# Patient Record
Sex: Male | Born: 1960 | Race: White | Hispanic: No | Marital: Single | Smoking: Never smoker
Health system: Southern US, Community
[De-identification: ages and names within clinical notes are randomized; demographics above are authoritative.]

## PROBLEM LIST (undated history)

## (undated) HISTORY — PX: HERNIA REPAIR: SHX51

## (undated) HISTORY — PX: HYDROCELE EXCISION / REPAIR: SUR1145

## (undated) HISTORY — PX: HIP ARTHROPLASTY: SHX981

---

## 2004-10-28 ENCOUNTER — Ambulatory Visit (HOSPITAL_BASED_OUTPATIENT_CLINIC_OR_DEPARTMENT_OTHER): Admission: RE | Admit: 2004-10-28 | Discharge: 2004-10-28 | Payer: Self-pay | Admitting: General Surgery

## 2004-10-28 ENCOUNTER — Ambulatory Visit (HOSPITAL_COMMUNITY): Admission: RE | Admit: 2004-10-28 | Discharge: 2004-10-28 | Payer: Self-pay | Admitting: General Surgery

## 2009-07-28 ENCOUNTER — Ambulatory Visit (HOSPITAL_BASED_OUTPATIENT_CLINIC_OR_DEPARTMENT_OTHER): Admission: RE | Admit: 2009-07-28 | Discharge: 2009-07-28 | Payer: Self-pay | Admitting: Urology

## 2011-03-03 LAB — POCT HEMOGLOBIN-HEMACUE: Hemoglobin: 15 g/dL (ref 13.0–17.0)

## 2011-04-11 NOTE — Op Note (Signed)
NAMECECILIA, Gregory Gentry              ACCOUNT NO.:  [id number]   MEDICAL RECORD NO.:  [id number]          PATIENT TYPE:  AMB   LOCATION:  [facility]                         FACILITY:  [facility]   PHYSICIAN:  Valetta Fuller, M.D.  DATE OF BIRTH:  September 21, 1961   DATE OF PROCEDURE:  07/28/2009  DATE OF DISCHARGE:  07/28/2009                               OPERATIVE REPORT   PREOPERATIVE DIAGNOSIS:  Large symptomatic left hydrocele.   POSTOPERATIVE DIAGNOSIS:  Large symptomatic left hydrocele.   PROCEDURE:  Repair of left hydrocele.   SURGEON:  Valetta Fuller, M.D.   ANESTHESIA:  General.   INDICATIONS:  Mr. Royse is a 50 year old male.  He presented several  months back with complaints of left scrotal swelling.  Ultrasound  confirmed a large hydrocele.  He was not particularly bothered by that  at that time and we recommended continued observation.  This has  substantially increased in size and has interfered with his normal  activities.  We reassessed him and found a very large tense hydrocele.  We suggested operative repair and discussed the pros and cons of this.  Full informed consent was obtained.  He presents now for the procedure.   PROCEDURE IN DETAIL:  The patient was brought to the operating room.  He  received perioperative Ancef.  He was placed in supine position.  He had  successful induction of general anesthesia and was then prepped and  draped in the usual manner.  A standard incision was made in the median  raphe.  A very tense large hydrocele sac was encountered.  This was  incised and approximately 800 mL of clear yellow fluid was obtained  consistent with simple hydrocele fluid.  The hydrocele sac itself was  not particularly thickened or inflamed.  The testis and adnexal  structures were otherwise unremarkable.  The redundant sac was reefed  utilizing some Vicryl suture and then bottlenecked to evert it.  Hemostasis was excellent.  The testis and scrotal compartment was  irrigated.  The testis was then put back into the left hemiscrotum  taking care not to twist the spermatic cord.  The scrotum was closed  with several layers of Vicryl suture and a pressure dressing was  applied.  A Marcaine spermatic cord block was also performed.  The  patient appeared to tolerate the procedure well.  There were no obvious  complications or difficulties.  He was brought to the recovery room in  stable condition.      Valetta Fuller, M.D.  Electronically Signed     DSG/MEDQ  D:  07/30/2009  T:  08/01/2009  Job:  [id number]

## 2011-04-14 NOTE — Op Note (Signed)
Gregory Gentry, Gregory Gentry              ACCOUNT NO.:  [id number]   MEDICAL RECORD NO.:  [id number]          PATIENT TYPE:  AMB   LOCATION:  [facility]                          FACILITY:  [facility]   PHYSICIAN:  Leonie Man, M.D.   DATE OF BIRTH:  August 11, 1961   DATE OF PROCEDURE:  10/28/2004  DATE OF DISCHARGE:                                 OPERATIVE REPORT   PREOPERATIVE DIAGNOSIS:  Right inguinal hernia.   POSTOPERATIVE DIAGNOSIS:  Right direct and indirect inguinal hernias.   PROCEDURE:  Right inguinal herniorrhaphy with mesh.   SURGEON:  Dr. Lurene Shadow.   ASSISTANT:  Nurse.   ANESTHESIA:  General.   NOTE:  [name] is a 49 year old man presenting with a right sided groin  bulge noted approximately two months ago. The bulge is associated with some  fairly vigorous activity.   The patient has no history of bladder neck obstruction or chronic  constipation. He does not have a chronic cough.   The risks and potential benefits of surgery have been discussed with the  patient and all questions answered and consent obtained.   PROCEDURE NOTE:  Following the induction of satisfactory general anesthesia  with the patient positioned supinely, the lower abdomen is prepped and  draped to be included in the sterile operative field. The operative field is  infiltrated with 0.5% Marcaine with epinephrine, and a transverse incision  is made in the lower abdominal crease through skin and subcutaneous tissue  with dissection carried down to the external oblique aponeurosis. The  external oblique aponeurosis was opened up through the external inguinal  ring  with protection of the ilioinguinal nerve which was retracted medially  and cephalad. The spermatic cord was dissected free and elevated and held  with a Penrose drain. Dissection along the anterior medial aspect of the  cord revealed a very large cord lipoma and associated indirect inguinal sac  which was dissected free and carried up to the  internal ring. Sac was  opened. No contents were in the sac. Sac was then doubly ligated with 2-0  silk and the redundant sac amputated. The direct space was attenuated  significantly, and an [place]lay patch of polypropylene mesh was sewn in at the  pubic tubercle, carried up along the conjoined tendon with a 2-0 Novofil  suture in a running fashion and again from the pubic tubercle along the  shelving edge of the Poupart's ligament to the internal ring. The mesh was  split so as to allow normal protrusion of the cord. The tails of the mesh  were then sutured down behind the cord with 2-0 Novofil sutures. The  reformed internal ring was noted to be snug but not constricting of the  cord. All areas of dissection were then checked for hemostasis. Hemostasis  was assured with electrocautery. Sponge, instrument, and sharp counts were  verified. The cord was replaced in its normal anatomic position, and the  external ring was closed by reapproximating the leaflets of the external  oblique aponeurosis. Scarpa's fascia was then closed with running 3-0  Vicryl suture, and the skin was closed with running  4-0 Monocryl suture and  then reinforced with Steri-Strips and sterile dressings applied. The  anesthetic reversed, the patient removed from the operating room to the  recovery room in stable condition. He tolerated the procedure well.      Patr   PB/MEDQ  D:  10/28/2004  T:  10/30/2004  Job:  [id number]

## 2012-10-09 ENCOUNTER — Ambulatory Visit (HOSPITAL_BASED_OUTPATIENT_CLINIC_OR_DEPARTMENT_OTHER): Payer: BC Managed Care – PPO | Attending: Otolaryngology | Admitting: Radiology

## 2012-10-09 DIAGNOSIS — G4733 Obstructive sleep apnea (adult) (pediatric): Secondary | ICD-10-CM

## 2012-10-09 DIAGNOSIS — G479 Sleep disorder, unspecified: Secondary | ICD-10-CM | POA: Insufficient documentation

## 2012-10-11 DIAGNOSIS — G4730 Sleep apnea, unspecified: Secondary | ICD-10-CM

## 2012-10-11 DIAGNOSIS — G4761 Periodic limb movement disorder: Secondary | ICD-10-CM

## 2012-10-11 DIAGNOSIS — G4710 Hypersomnia, unspecified: Secondary | ICD-10-CM

## 2012-10-13 NOTE — Procedures (Signed)
NAME:  Gregory Gentry, CREEKMORE              ACCOUNT NO.:  [id number]  MEDICAL RECORD NO.:  [id number]          PATIENT TYPE:  OUT  LOCATION:  SLEEP CENTER                 FACILITY:  [facility]  PHYSICIAN:  Clinton D. Maple Hudson, MD, FCCP, FACPDATE OF BIRTH:  26-Oct-1961  DATE OF STUDY:  10/09/2012                           NOCTURNAL POLYSOMNOGRAM  REFERRING PHYSICIAN:  Antony Contras, MD  REFERRING PHYSICIAN:  Antony Contras, MD  INDICATION FOR STUDY:  Hypersomnia with sleep apnea.  EPWORTH SLEEPINESS SCORE:  2/24.  BMI 27.1, weight 217 pounds, height 75 inches, neck 7 inches.  MEDICATIONS:  Home medications are charted and reviewed.  SLEEP ARCHITECTURE:  Total sleep time 385 minutes with sleep efficiency 84.8%.  Stage I 3.4%, stage II 74.7%, stage III 1%, REM 20.9% of total sleep time.  Sleep latency 28.5 minutes, REM latency 202.5 minutes, awake after sleep onset 40.4 minutes.  Arousal index 22.1.  Bedtime medication:  None.  RESPIRATORY DATA:  Apnea/hypopnea index (AHI) 3.1 per hour.  A total of 20 events were scored including 4 obstructive apneas and 16 hypopneas. Events were not positional.  REM AHI 14.2 per hour.  There were not enough events to permit application of split protocol, CPAP titration.  OXYGEN DATA:  Moderate snoring with oxygen desaturation to a nadir of 84% and mean oxygen saturation through the study of 94.7% on room air.  CARDIAC DATA:  Normal sinus rhythm.  MOVEMENT/PARASOMNIA:  A total of 134 limb jerks were counted, of which 74 were associated with arousal or awakening for periodic limb movement with arousal index of 11.5 per hour. Bathroom x1.  IMPRESSION/RECOMMENDATION: 1. Unremarkable sleep architecture for sleep center environment     without medication. 2. Occasional respiratory events with sleep disturbance, within normal     limits.  AHI 3.1 per hour (the normal range for adults is from 0-5     events per hour).  Moderate snoring with oxygen desaturation to  a     nadir of 84% and mean oxygen saturation through the study of 94.7%     on room air. 3. Periodic limb movement with arousal syndrome.  A total of 134 limb     jerks were counted, of which 74 were associated with arousal or     awakening for periodic limb movement with arousal index of 11.5 per     hour.  This     may be associated with enough sleep disturbance in the home     environment to justify a trial of specific therapy such as ReQuip     or Mirapex if appropriate.     Clinton D. Maple Hudson, MD, [facility], FACP Diplomate, American Board of Sleep Medicine    CDY/MEDQ  D:  10/12/2012 12:08:29  T:  10/12/2012 23:55:59  Job:  [id number]

## 2014-06-08 ENCOUNTER — Encounter (HOSPITAL_COMMUNITY): Payer: Self-pay | Admitting: Emergency Medicine

## 2014-06-08 ENCOUNTER — Emergency Department (HOSPITAL_COMMUNITY)
Admission: EM | Admit: 2014-06-08 | Discharge: 2014-06-09 | Disposition: A | Discharge status: Home / Self Care | Payer: BC Managed Care – PPO | Attending: Emergency Medicine | Admitting: Emergency Medicine

## 2014-06-08 ENCOUNTER — Emergency Department (HOSPITAL_COMMUNITY): Payer: BC Managed Care – PPO

## 2014-06-08 DIAGNOSIS — J392 Other diseases of pharynx: Secondary | ICD-10-CM | POA: Insufficient documentation

## 2014-06-08 DIAGNOSIS — Z79899 Other long term (current) drug therapy: Secondary | ICD-10-CM | POA: Insufficient documentation

## 2014-06-08 DIAGNOSIS — IMO0002 Reserved for concepts with insufficient information to code with codable children: Secondary | ICD-10-CM | POA: Insufficient documentation

## 2014-06-08 DIAGNOSIS — F411 Generalized anxiety disorder: Secondary | ICD-10-CM | POA: Insufficient documentation

## 2014-06-08 DIAGNOSIS — Z7982 Long term (current) use of aspirin: Secondary | ICD-10-CM | POA: Insufficient documentation

## 2014-06-08 DIAGNOSIS — R0789 Other chest pain: Secondary | ICD-10-CM | POA: Insufficient documentation

## 2014-06-08 DIAGNOSIS — R079 Chest pain, unspecified: Secondary | ICD-10-CM

## 2014-06-08 LAB — BASIC METABOLIC PANEL
ANION GAP: 15 (ref 5–15)
BUN: 14 mg/dL (ref 6–23)
CHLORIDE: 100 meq/L (ref 96–112)
CO2: 24 meq/L (ref 19–32)
CREATININE: 1 mg/dL (ref 0.50–1.35)
Calcium: 10.2 mg/dL (ref 8.4–10.5)
GFR calc Af Amer: >90 mL/min (ref >90)
GFR calc non Af Amer: 84 mL/min — ABNORMAL LOW (ref >90)
Glucose, Bld: 124 mg/dL — ABNORMAL HIGH (ref 70–99)
Potassium: 3.8 mEq/L (ref 3.7–5.3)
Sodium: 139 mEq/L (ref 137–147)

## 2014-06-08 LAB — CBC
HEMATOCRIT: 41.9 % (ref 39.0–52.0)
Hemoglobin: 14.6 g/dL (ref 13.0–17.0)
MCH: 29.3 pg (ref 26.0–34.0)
MCHC: 34.8 g/dL (ref 30.0–36.0)
MCV: 84 fL (ref 78.0–100.0)
PLATELETS: 256 10*3/uL (ref 150–400)
RBC: 4.99 MIL/uL (ref 4.22–5.81)
RDW: 13.9 % (ref 11.5–15.5)
WBC: 8.4 10*3/uL (ref 4.0–10.5)

## 2014-06-08 LAB — I-STAT TROPONIN, ED: Troponin i, poc: 0.01 ng/mL (ref 0.00–0.08)

## 2014-06-08 LAB — TROPONIN I

## 2014-06-08 NOTE — ED Notes (Signed)
Pt states that he started having midsternal chest pain approx. 4 hours ago. Also notes nausea. Denies SOB or numbness.

## 2014-06-08 NOTE — ED Provider Notes (Signed)
CSN: [id number]     Arrival date & time 06/08/14  2200 History   First MD Initiated Contact with Patient 06/08/14 2306     Chief Complaint  Patient presents with  . Chest Pain     (Consider location/radiation/quality/duration/timing/severity/associated sxs/prior Treatment) HPI Patient presents with mid lower chest tightness starting around 5:30 this evening. Patient states he went for a run and was resting at home when he noticed the tightness. The tightness does not radiate. His nose shortness of breath associated with it. He has mild anxiety and states his stomach feels "uneasy". He has no previous history of coronary artery disease. Patient had a stress test in 2009 which was normal. He has no family history of coronary artery disease. Lower extremity swelling or pain. He states the tightness has improved since being in the emergency department. He took 2 baby aspirin to prior to coming to the emergency department. No past medical history on file. Past Surgical History  Procedure Laterality Date  . Hernia repair    . Hydrocele excision / repair    . Hip arthroplasty     No family history on file. History  Substance Use Topics  . Smoking status: Never Smoker   . Smokeless tobacco: Not on file  . Alcohol Use: Yes    Review of Systems  Constitutional: Negative for fever and chills.  Respiratory: Positive for chest tightness. Negative for cough and shortness of breath.   Cardiovascular: Negative for chest pain, palpitations and leg swelling.  Gastrointestinal: Negative for nausea, vomiting, abdominal pain, diarrhea and constipation.  Musculoskeletal: Negative for back pain, myalgias, neck pain and neck stiffness.  Skin: Negative for rash and wound.  Neurological: Negative for dizziness, weakness, light-headedness, numbness and headaches.  All other systems reviewed and are negative.     Allergies  Review of patient's allergies indicates no known allergies.  Home Medications    Prior to Admission medications   Medication Sig Start Date End Date Taking? Authorizing Provider  aspirin EC 81 MG tablet Take 81 mg by mouth daily.   Yes Historical Provider, MD  B Complex-C (B-COMPLEX WITH VITAMIN C) tablet Take 1 tablet by mouth daily.   Yes Historical Provider, MD  cetirizine (ZYRTEC) 10 MG tablet Take 10 mg by mouth daily.   Yes Historical Provider, MD  fluticasone (FLONASE) 50 MCG/ACT nasal spray Place 1 spray into both nostrils daily.   Yes Historical Provider, MD  losartan (COZAAR) 50 MG tablet Take 50 mg by mouth daily.  03/02/14  Yes Historical Provider, MD  Multiple Vitamin (MULTIVITAMIN WITH MINERALS) TABS tablet Take 1 tablet by mouth daily.   Yes Historical Provider, MD  vitamin C (ASCORBIC ACID) 500 MG tablet Take 1,000 mg by mouth daily.   Yes Historical Provider, MD   BP 134/99  Temp(Src) 98.4 F (36.9 C) (Oral)  Resp 15  Ht 6\' 3"  (1.905 m)  Wt 220 lb (99.791 kg)  BMI 27.50 kg/m2  SpO2 98% Physical Exam  Nursing note and vitals reviewed. Constitutional: He is oriented to person, place, and time. He appears well-developed and well-nourished. No distress.  HENT:  Head: Normocephalic and atraumatic.  Mouth/Throat: Oropharyngeal exudate present.  Eyes: EOM are normal. Pupils are equal, round, and reactive to light.  Neck: Normal range of motion. Neck supple.  Cardiovascular: Normal rate and regular rhythm.   Pulmonary/Chest: Effort normal and breath sounds normal. No respiratory distress. He has no wheezes. He has no rales. He exhibits no tenderness.  Abdominal: Soft.  Bowel sounds are normal. He exhibits no distension and no mass. There is no tenderness. There is no rebound and no guarding.  Musculoskeletal: Normal range of motion. He exhibits no edema and no tenderness.  No calf swelling or tenderness.  Neurological: He is alert and oriented to person, place, and time.  5/5 motor in all extremities. Sensation is grossly intact.  Skin: Skin is warm and  dry. No rash noted. No erythema.  Psychiatric: He has a normal mood and affect. His behavior is normal.    ED Course  Procedures (including critical care time) Labs Review Labs Reviewed  BASIC METABOLIC PANEL - Abnormal; Notable for the following:    Glucose, Bld 124 (*)    GFR calc non Af Amer 84 (*)    All other components within normal limits  CBC  TROPONIN I  TROPONIN I  I-STAT TROPOININ, ED    Imaging Review No results found.   EKG Interpretation   Date/Time:  Monday June 08 2014 22:07:19 EDT Ventricular Rate:  67 PR Interval:  169 QRS Duration: 107 QT Interval:  403 QTC Calculation: 425 R Axis:   56 Text Interpretation:  Sinus rhythm Probable left atrial enlargement  Consider anterior infarct Baseline wander in lead(s) V3 Confirmed by  Kent Braunschweig  MD, Tyran Huser ([id number]) on 06/08/2014 11:12:36 PM      MDM   Final diagnoses:  None    Tightness does not seem to be exertional. EKG without any evidence of ischemia. Initial troponin is normal. Patient is low risk with no significant risk factors. Will get help delta troponin and reevaluate.  Patient with negative repeat troponin. He continues to be symptom-free. On further discussion patient states he has a high stress and anxiety levels. I discussed with cardiology on call. Agree with plan to discharge the patient home and have the patient followup with cardiologist in the next few days for possible provocative testing. Patient is in agreement with plan. He's been informed to return immediately for worsening symptoms including pain, shortness of breath or any concerns.  Loren Raceravid Emiliano Welshans, MD 06/09/14 [phone]

## 2014-06-09 LAB — TROPONIN I

## 2014-06-09 NOTE — Discharge Instructions (Signed)
Call and make an appointment to followup with a cardiologist this week. Return immediately for worsening pain, shortness of breath or for any concerns.  Chest Pain (Nonspecific) It is often hard to give a specific diagnosis for the cause of chest pain. There is always a chance that your pain could be related to something serious, such as a heart attack or a blood clot in the lungs. You need to follow up with your health care provider for further evaluation. CAUSES   Heartburn.  Pneumonia or bronchitis.  Anxiety or stress.  Inflammation around your heart (pericarditis) or lung (pleuritis or pleurisy).  A blood clot in the lung.  A collapsed lung (pneumothorax). It can develop suddenly on its own (spontaneous pneumothorax) or from trauma to the chest.  Shingles infection (herpes zoster virus). The chest wall is composed of bones, muscles, and cartilage. Any of these can be the source of the pain.  The bones can be bruised by injury.  The muscles or cartilage can be strained by coughing or overwork.  The cartilage can be affected by inflammation and become sore (costochondritis). DIAGNOSIS  Lab tests or other studies may be needed to find the cause of your pain. Your health care provider may have you take a test called an ambulatory electrocardiogram (ECG). An ECG records your heartbeat patterns over a 24-hour period. You may also have other tests, such as:  Transthoracic echocardiogram (TTE). During echocardiography, sound waves are used to evaluate how blood flows through your heart.  Transesophageal echocardiogram (TEE).  Cardiac monitoring. This allows your health care provider to monitor your heart rate and rhythm in real time.  Holter monitor. This is a portable device that records your heartbeat and can help diagnose heart arrhythmias. It allows your health care provider to track your heart activity for several days, if needed.  Stress tests by exercise or by giving medicine  that makes the heart beat faster. TREATMENT   Treatment depends on what may be causing your chest pain. Treatment may include:  Acid blockers for heartburn.  Anti-inflammatory medicine.  Pain medicine for inflammatory conditions.  Antibiotics if an infection is present.  You may be advised to change lifestyle habits. This includes stopping smoking and avoiding alcohol, caffeine, and chocolate.  You may be advised to keep your head raised (elevated) when sleeping. This reduces the chance of acid going backward from your stomach into your esophagus. Most of the time, nonspecific chest pain will improve within 2-3 days with rest and mild pain medicine.  HOME CARE INSTRUCTIONS   If antibiotics were prescribed, take them as directed. Finish them even if you start to feel better.  For the next few days, avoid physical activities that bring on chest pain. Continue physical activities as directed.  Do not use any tobacco products, including cigarettes, chewing tobacco, or electronic cigarettes.  Avoid drinking alcohol.  Only take medicine as directed by your health care provider.  Follow your health care provider's suggestions for further testing if your chest pain does not go away.  Keep any follow-up appointments you made. If you do not go to an appointment, you could develop lasting (chronic) problems with pain. If there is any problem keeping an appointment, call to reschedule. SEEK MEDICAL CARE IF:   Your chest pain does not go away, even after treatment.  You have a rash with blisters on your chest.  You have a fever. SEEK IMMEDIATE MEDICAL CARE IF:   You have increased chest pain or pain  that spreads to your arm, neck, jaw, back, or abdomen.  You have shortness of breath.  You have an increasing cough, or you cough up blood.  You have severe back or abdominal pain.  You feel nauseous or vomit.  You have severe weakness.  You faint.  You have chills. This is an  emergency. Do not wait to see if the pain will go away. Get medical help at once. Call your local emergency services (911 in [place] Do not drive yourself to the hospital. MAKE SURE YOU:   Understand these instructions.  Will watch your condition.  Will get help right away if you are not doing well or get worse. Document Released: 08/23/2005 Document Revised: 11/18/2013 Document Reviewed: 06/18/2008 [facility] Patient Information 2015 [place], [place]. This information is not intended to replace advice given to you by your health care provider. Make sure you discuss any questions you have with your health care provider.

## 2014-06-12 ENCOUNTER — Encounter: Payer: Self-pay | Admitting: Cardiovascular Disease

## 2014-06-12 ENCOUNTER — Ambulatory Visit (INDEPENDENT_AMBULATORY_CARE_PROVIDER_SITE_OTHER): Payer: BC Managed Care – PPO | Admitting: Cardiovascular Disease

## 2014-06-12 VITALS — BP 130/85 | HR 56 | Ht 75.0 in | Wt 221.4 lb

## 2014-06-12 DIAGNOSIS — R0789 Other chest pain: Secondary | ICD-10-CM

## 2014-06-12 DIAGNOSIS — R9431 Abnormal electrocardiogram [ECG] [EKG]: Secondary | ICD-10-CM

## 2014-06-12 LAB — LIPID PANEL
Cholesterol: 189 mg/dL (ref 0–200)
HDL: 41.9 mg/dL (ref >39.00)
LDL Cholesterol: 131 mg/dL — ABNORMAL HIGH (ref 0–99)
NONHDL: 147.1
Total CHOL/HDL Ratio: 5
Triglycerides: 79 mg/dL (ref 0.0–149.0)
VLDL: 15.8 mg/dL (ref 0.0–40.0)

## 2014-06-12 LAB — BASIC METABOLIC PANEL
BUN: 15 mg/dL (ref 6–23)
CALCIUM: 9.9 mg/dL (ref 8.4–10.5)
CO2: 26 meq/L (ref 19–32)
CREATININE: 1.1 mg/dL (ref 0.4–1.5)
Chloride: 103 mEq/L (ref 96–112)
GFR: 76.72 mL/min (ref >60.00)
GLUCOSE: 97 mg/dL (ref 70–99)
Potassium: 4.2 mEq/L (ref 3.5–5.1)
Sodium: 139 mEq/L (ref 135–145)

## 2014-06-12 LAB — HEPATIC FUNCTION PANEL
ALBUMIN: 4.9 g/dL (ref 3.5–5.2)
ALK PHOS: 51 U/L (ref 39–117)
ALT: 24 U/L (ref 0–53)
AST: 25 U/L (ref 0–37)
BILIRUBIN DIRECT: 0.2 mg/dL (ref 0.0–0.3)
BILIRUBIN TOTAL: 1.4 mg/dL — AB (ref 0.2–1.2)
Total Protein: 7.4 g/dL (ref 6.0–8.3)

## 2014-06-12 NOTE — Progress Notes (Signed)
Gregory Gentry Date of Birth  1961-11-24       [facility]    [facility] [place] [facility] [address], [address]  [address], [address] [place], [place]  [address]   [place]  [id number] [phone]     [phone]   Fax  [phone]     Fax [phone]  Problem List: 1. Chest pain 2. Hx of low HDL ( 35-40)   History of Present Illness:  Gregory Gentry is a 53 yo with recent hx of CP / tightness. He is a Patent examinerregualar runner / cyclist.  Thursday night, he ran 4 miles, .  After the run he developed some mild CP.  He monitored his HR and found his max to  be 170. BP is typically normal - later that night, he noted some mild elevations of his diastolic BP.  The following day, he rode his mountain bike for 1 1/2 hours and had no CP.  Monday night, ran 3 miles, ( was very hot)  Had the same sensation.  He presented to the ER  Denies any dyspnea,   He lifts weights on the days that he does not run or bike.  He works at [employer].  He has been under lots of stress.  In 2009, he was under lots of stress on a long , big stressful project,  Had some pain after a run.  he had a stress test which was negative.    Father is 8986 - just got a pacer. No hx of CAD    Current Outpatient Prescriptions on File Prior to Visit  Medication Sig Dispense Refill  . aspirin EC 81 MG tablet Take 81 mg by mouth daily.      . B Complex-C (B-COMPLEX WITH VITAMIN C) tablet Take 1 tablet by mouth daily.      . cetirizine (ZYRTEC) 10 MG tablet Take 10 mg by mouth daily.      . fluticasone (FLONASE) 50 MCG/ACT nasal spray Place 1 spray into both nostrils daily.      Marland Kitchen. losartan (COZAAR) 50 MG tablet Take 50 mg by mouth daily.       . Multiple Vitamin (MULTIVITAMIN WITH MINERALS) TABS tablet Take 1 tablet by mouth daily.      . vitamin C (ASCORBIC ACID) 500 MG tablet Take 1,000 mg by mouth daily.       No current facility-administered medications on file prior to visit.    No Known Allergies  No  past medical history on file.  Past Surgical History  Procedure Laterality Date  . Hernia repair    . Hydrocele excision / repair    . Hip arthroplasty      History  Smoking status  . Never Smoker   Smokeless tobacco  . Not on file    History  Alcohol Use  . Yes    No family history on file.  Reviw of Systems:  Reviewed in the HPI.  All other systems are negative.  Physical Exam: Blood pressure 130/85, pulse 56, height 6\' 3"  (1.905 m), weight 221 lb 6.4 oz (100.426 kg). Wt Readings from Last 3 Encounters:  06/12/14 221 lb 6.4 oz (100.426 kg)  06/08/14 220 lb (99.791 kg)     General: Well developed, well nourished, in no acute distress.  Head: Normocephalic, atraumatic, sclera non-icteric, mucus membranes are moist,   Neck: Supple. Carotids are 2 + without bruits. No JVD   Lungs: Clear   Heart: RR,  normal S12  Abdomen: Soft, non-tender, non-distended with normal bowel sounds.  Msk:  Strength and tone are normal   Extremities: No clubbing or cyanosis. No edema.  Distal pedal pulses are 2+ and equal    Neuro: CN II - XII intact.  Alert and oriented X 3.   Psych:  Normal   ECG: From the ER - NSR , poor R wave progression c/w possible ant MI.  No acute changes   Assessment / Plan:

## 2014-06-12 NOTE — Assessment & Plan Note (Signed)
Gregory Gentry presents today for further evaluation of several episodes of chest discomfort that occurred when after he has been exercising. He's been an active runner for many years. Also rides a bike on occasion. On 2 separate occasions he's had exertional chest discomfort/tightness and heaviness in his chest. These resolved after about 30 minutes. On one occasion he went to the emergency room his workup was negative.  Importantly, his EKG during that visit was abnormal. He has poor R-wave progression consistent with a possible anterior wall myocardial infarction. He now presents for further evaluation.  He has a history of low HDL and also has a history of hypertension. Because of he's risk factors and because of his symptoms, I think that he needs a stress Myoview study.  I would like for him to exercise up to a heart rate of between 150 and 155 which is between 90 and 95% of his predicted maximal heart rate.    Fasting lipids today. He has significant dyslipidemia, will start him on atorvastatin. He's already on aspirin 81 mg a day.  i'll see him  back in one month- sooner if needed.

## 2014-06-12 NOTE — Patient Instructions (Addendum)
Your physician has requested that you have en exercise stress myoview. For further information please visit [email or web address]. Please follow instruction sheet, as given.  Your physician recommends that you have lab work:  TODAY - cholesterol, liver and basic metabolic panel  Your physician recommends that you continue on your current medications as directed. Please refer to the Current Medication list given to you today.  Your physician recommends that you return for follow-up on:  Monday August 24 at 8:15 am

## 2014-06-15 ENCOUNTER — Ambulatory Visit (HOSPITAL_COMMUNITY): Payer: BC Managed Care – PPO | Attending: Cardiovascular Disease | Admitting: Radiology

## 2014-06-15 ENCOUNTER — Telehealth: Payer: Self-pay | Admitting: Nurse Practitioner

## 2014-06-15 VITALS — BP 136/96 | HR 55 | Ht 75.0 in | Wt 214.0 lb

## 2014-06-15 DIAGNOSIS — R0789 Other chest pain: Secondary | ICD-10-CM

## 2014-06-15 DIAGNOSIS — Z87891 Personal history of nicotine dependence: Secondary | ICD-10-CM | POA: Insufficient documentation

## 2014-06-15 DIAGNOSIS — I10 Essential (primary) hypertension: Secondary | ICD-10-CM | POA: Insufficient documentation

## 2014-06-15 DIAGNOSIS — R9431 Abnormal electrocardiogram [ECG] [EKG]: Secondary | ICD-10-CM

## 2014-06-15 DIAGNOSIS — R079 Chest pain, unspecified: Secondary | ICD-10-CM

## 2014-06-15 MED ORDER — TECHNETIUM TC 99M SESTAMIBI GENERIC - CARDIOLITE
33.0000 | Freq: Once | INTRAVENOUS | Status: AC | PRN
  Administered 2014-06-15: 33 via INTRAVENOUS

## 2014-06-15 MED ORDER — ATORVASTATIN CALCIUM 20 MG PO TABS
20.0000 mg | ORAL_TABLET | Freq: Every day | ORAL | Status: DC
Start: 2014-06-15 — End: 2014-07-20

## 2014-06-15 MED ORDER — TECHNETIUM TC 99M SESTAMIBI GENERIC - CARDIOLITE
11.0000 | Freq: Once | INTRAVENOUS | Status: AC | PRN
Start: 2014-06-15 — End: 2014-06-15
  Administered 2014-06-15: 11 via INTRAVENOUS

## 2014-06-15 NOTE — Telephone Encounter (Signed)
Prescription for Atorvastatin 20 mg once daily sent to [facility] [place].  Left message for patient to call for lab results and to schedule recheck of lipids in 3 months.

## 2014-06-15 NOTE — [place] Notes (Signed)
[facility] NUCLE[facility] MED [address] [place], [place] [address] [phone]    Cardiology Nuclear Med Study  Gregory Gentry is a 53 y.o. male     MRN : [id number]     DOB: [birth year]  Procedure Date: 06/15/2014  Nuclear Med Background Indication for Stress Test:  Evaluation for Ischemia, and Patient seen in hospital on 06-08-2014 for Chest Pain,  Enzymes negative History:  No known CAD, Normal GXT 2009 Cardiac Risk Factors: History of Smoking and Hypertension  Symptoms:  Chest Pain and Chest Pain with Exertion    Nuclear Pre-Procedure Caffeine/Decaff Intake:  None> 12 hrs NPO After: 9:00pm   Lungs:  clear O2 Sat: 97% on room air. IV 0.9% NS with Angio Cath:  22g  IV Site: R Antecubital x 1, tolerated well IV Started by:  Irean HongPatsy Edwards, RN  Chest Size (in):  46 Cup Size: n/a  Height: 6\' 3"  (1.905 m)  Weight:  214 lb (97.07 kg)  BMI:  Body mass index is 26.75 kg/(m^2). Tech Comments:  Patient took Cozaar this am. Irean HongPatsy Edwards, RN.    Nuclear Med Study 1 or 2 day study: 1 day  Stress Test Type:  Stress  Reading MD: N/A  Order Authorizing Provider:  Kristeen MissPhilip Nahser, MD  Resting Radionuclide: Technetium 4922m Sestamibi  Resting Radionuclide Dose: 11.0 mCi   Stress Radionuclide:  Technetium 6322m Sestamibi  Stress Radionuclide Dose: 33.0 mCi           Stress Protocol Rest HR: 55 Stress HR: 166  Rest BP: 136/96 Stress BP: 187/86  Exercise Time (min): 13:00 METS: 15.1           Dose of Adenosine (mg):  n/a Dose of Lexiscan: n/a mg  Dose of Atropine (mg): n/a Dose of Dobutamine: n/a mcg/kg/min (at max HR)  Stress Test Technologist: Nelson ChimesSharon Brooks, BS-ES  Nuclear Technologist:  Harlow AsaElizabeth Young, CNMT     Rest Procedure:  Myocardial perfusion imaging was performed at rest 45 minutes following the intravenous administration of Technetium 5422m Sestamibi. Rest ECG: NSR - Normal EKG  Stress Procedure:  The patient exercised on the treadmill utilizing the Bruce  Protocol for 13:00 minutes. The patient stopped due to fatigue and denied any chest pain.  Technetium 10822m Sestamibi was injected at peak exercise and myocardial perfusion imaging was performed after a brief delay. Stress ECG: There was less than 1mm of upsloping ST segment depression at peak exercise  QPS Raw Data Images:  Mild diaphragmatic attenuation.  Normal left ventricular size. Stress Images:  small defect in the basal and mild inferior wall Rest Images:  Normal homogeneous uptake in all areas of the myocardium. Subtraction (SDS):  very small area of reduced uptake on stress imaging in the mid and basal inferior wall Transient Ischemic Dilatation (Normal <1.22):  1.05 Lung/Heart Ratio (Normal <0.45):  0.35  Quantitative Gated Spect Images QGS EDV:  153 ml QGS ESV:  68 ml  Impression Exercise Capacity:  Excellent exercise capacity. BP Response:  Normal blood pressure response. Clinical Symptoms:  No significant symptoms noted. ECG Impression:  No significant ST segment change suggestive of ischemia. Comparison with Prior Nuclear Study: No images to compare  Overall Impression:  Low risk stress nuclear study Very subtle area of reduced uptake in the mid and basal inferior wall that most likely represents variations in diaphramagtic attneuation artifact.  Cannot rule out a very small area of ischemia..  LV Ejection Fraction: 56%.  LV Wall Motion:  NL LV Function; NL Wall Motion  Signed: Fransico Him, MD [facility]

## 2014-06-15 NOTE — Telephone Encounter (Signed)
Message copied by Levi AlandSWINYER, Brittane Grudzinski M on Mon Jun 15, 2014  4:47 PM ------      Message from: Vesta MixerNAHSER, PHILIP J      Created: Sun Jun 14, 2014  2:08 PM       Please ask him to start atorvastatin 20 mg a day.       Check fasting labs in 3 months ------

## 2014-06-17 ENCOUNTER — Telehealth: Payer: Self-pay | Admitting: Cardiovascular Disease

## 2014-06-17 ENCOUNTER — Telehealth: Payer: Self-pay | Admitting: Nurse Practitioner

## 2014-06-17 DIAGNOSIS — E785 Hyperlipidemia, unspecified: Secondary | ICD-10-CM

## 2014-06-17 NOTE — Telephone Encounter (Signed)
I reviewed Myoview and lab results with patient who verbalized understanding and agreement to start Atorvastatin 20 mg once daily.  Patient states he has picked up Rx at [facility] and will begin taking today.  Patient scheduled for follow-up lab work in October and I verified his appointment for August with Dr. Elease HashimotoNahser.

## 2014-06-17 NOTE — Telephone Encounter (Signed)
°  Patient is returning your call from Monday. Please call back.

## 2014-06-17 NOTE — Telephone Encounter (Signed)
Message copied by Levi AlandSWINYER, Arora Coakley M on Wed Jun 17, 2014 10:15 AM ------      Message from: Vesta MixerNAHSER, PHILIP J      Created: Sun Jun 14, 2014  2:08 PM       Please ask him to start atorvastatin 20 mg a day.       Check fasting labs in 3 months ------

## 2014-07-20 ENCOUNTER — Ambulatory Visit (INDEPENDENT_AMBULATORY_CARE_PROVIDER_SITE_OTHER): Payer: BC Managed Care – PPO | Admitting: Cardiovascular Disease

## 2014-07-20 ENCOUNTER — Ambulatory Visit: Payer: BC Managed Care – PPO | Admitting: Cardiovascular Disease

## 2014-07-20 ENCOUNTER — Encounter: Payer: Self-pay | Admitting: Cardiovascular Disease

## 2014-07-20 VITALS — BP 120/80 | HR 60 | Ht 75.0 in | Wt 222.2 lb

## 2014-07-20 DIAGNOSIS — R0789 Other chest pain: Secondary | ICD-10-CM

## 2014-07-20 NOTE — [place] Notes (Signed)
Gregory Gentry Date of Birth  [birth year]       [facility]    [place] [address]  [address] [place], [place]  [address]   [place], [place]  [address] [phone]     [phone]   Fax  [phone]     Fax [phone]  Problem List: 1. Chest pain 2. Hx of low HDL ( 35-40)   History of Present Illness:  Gregory Gentry is a 53 yo with recent hx of CP / tightness. He is a Patent examiner runner / cyclist.  Thursday night, he ran 4 miles, .  After the run he developed some mild CP.  He monitored his HR and found his max to  be 170. BP is typically normal - later that night, he noted some mild elevations of his diastolic BP.  The following day, he rode his mountain bike for 1 1/2 hours and had no CP.  Monday night, ran 3 miles, ( was very hot)  Had the same sensation.  He presented to the ER  Denies any dyspnea,   He lifts weights on the days that he does not run or bike.  He works at [employer].  He has been under lots of stress.  In 2009, he was under lots of stress on a long , big stressful project,  Had some pain after a run.  he had a stress test which was negative.    Father is 44 - just got a pacer. No hx of CAD  July 20, 2014:  Present presented with some episodes of chest discomfort about a month ago.  He had a stress Myoview study which was read out at low risk.   He had a very small in the mid and basilar inferior wall with  stress with relatively normal uptake at rest.    He has an ejection fraction of 56%.    He has decreased his exercise slightly.   He is cycling less .  He still runs but not quite as far.   He is walking regularly and is lifting weights more.    We had started Lipitor which he did not start taking (worried about the side effects) .    Current Outpatient Prescriptions on File Prior to Visit  Medication Sig Dispense Refill  . aspirin EC 81 MG tablet Take 81 mg by mouth daily.      . B Complex-C (B-COMPLEX  WITH VITAMIN C) tablet Take 1 tablet by mouth daily.      . cetirizine (ZYRTEC) 10 MG tablet Take 10 mg by mouth daily.      . fluticasone (FLONASE) 50 MCG/ACT nasal spray Place 1 spray into both nostrils daily.      Marland Kitchen losartan (COZAAR) 50 MG tablet Take 50 mg by mouth daily.       . Multiple Vitamin (MULTIVITAMIN WITH MINERALS) TABS tablet Take 1 tablet by mouth daily.      . vitamin C (ASCORBIC ACID) 500 MG tablet Take 1,000 mg by mouth daily.       No current facility-administered medications on file prior to visit.    No Known Allergies  No past medical history on file.  Past Surgical History  Procedure Laterality Date  . Hernia repair    . Hydrocele excision / repair    . Hip arthroplasty      History  Smoking status  . Never Smoker   Smokeless tobacco  .  Not on file    History  Alcohol Use  . Yes    No family history on file.  Reviw of Systems:  Reviewed in the HPI.  All other systems are negative.  Physical Exam: Blood pressure 120/80, pulse 60, height  (1.905 m), weight 222 lb 3.2 oz (100.789 kg). Wt Readings from Last 3 Encounters:  07/20/14 222 lb 3.2 oz (100.789 kg)  06/15/14 214 lb (97.07 kg)  06/12/14 221 lb 6.4 oz (100.426 kg)     General: Well developed, well nourished, in no acute distress.  Head: Normocephalic, atraumatic, sclera non-icteric, mucus membranes are moist,   Neck: Supple. Carotids are 2 + without bruits. No JVD   Lungs: Clear   Heart: RR, normal S12  Abdomen: Soft, non-tender, non-distended with normal bowel sounds.  Msk:  Strength and tone are normal   Extremities: No clubbing or cyanosis. No edema.  Distal pedal pulses are 2+ and equal    Neuro: CN II - XII intact.  Alert and oriented X 3.   Psych:  Normal   ECG: From the ER - NSR , poor R wave progression c/w possible ant MI.  No acute changes   Assessment / Plan:

## 2014-07-20 NOTE — Patient Instructions (Addendum)
Your physician recommends that you schedule a follow-up appointment in: as needed with Dr. Nahser  Your physician recommends that you continue on your current medications as directed. Please refer to the Current Medication list given to you today.  

## 2014-07-20 NOTE — Assessment & Plan Note (Signed)
Javel is doing well. He's not had any further episodes of chest discomfort. His Myoview study was low risk. He exercised for 13 minutes and had a very small and insignificant inferior basilar defect. It was read out as low risk.  He does have some hyperlipidemia. He did not want to take her Lipitor because of the potential side effects.  We discussed having him return to see our  lipid clinic and he will give that some consideration.   I have encouraged him to continue with a healthy lifestyle.  I will see him on an as needed basis.

## 2014-09-22 ENCOUNTER — Other Ambulatory Visit: Payer: BC Managed Care – PPO

## 2015-11-17 IMAGING — CR DG CHEST 2V
2 series · 2 of 2 positions shown · non-contrast
Comparison: 02/01/2014

CLINICAL DATA: Chest pain

EXAM:
CHEST  2 VIEW

[w chest pa]
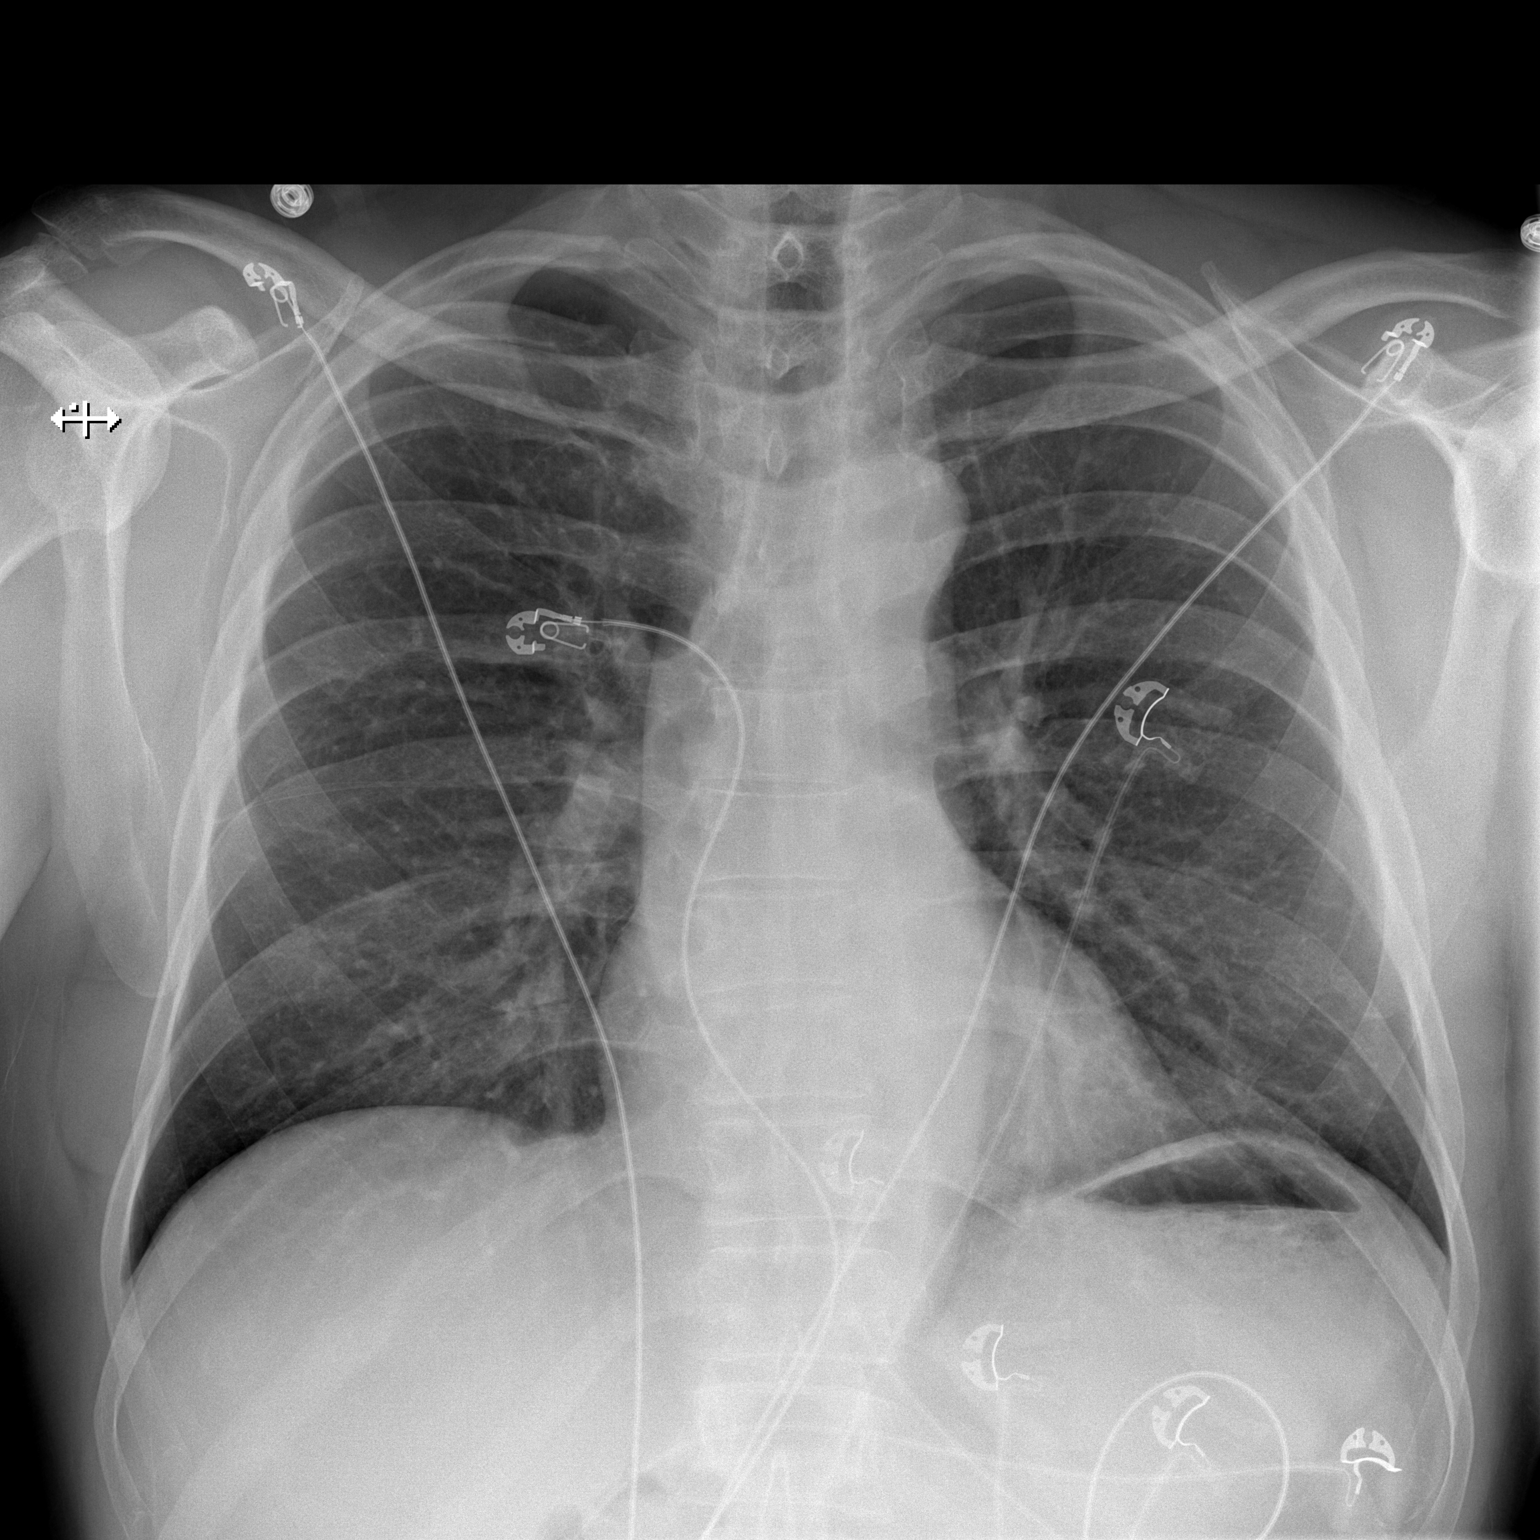

[w chest lat]
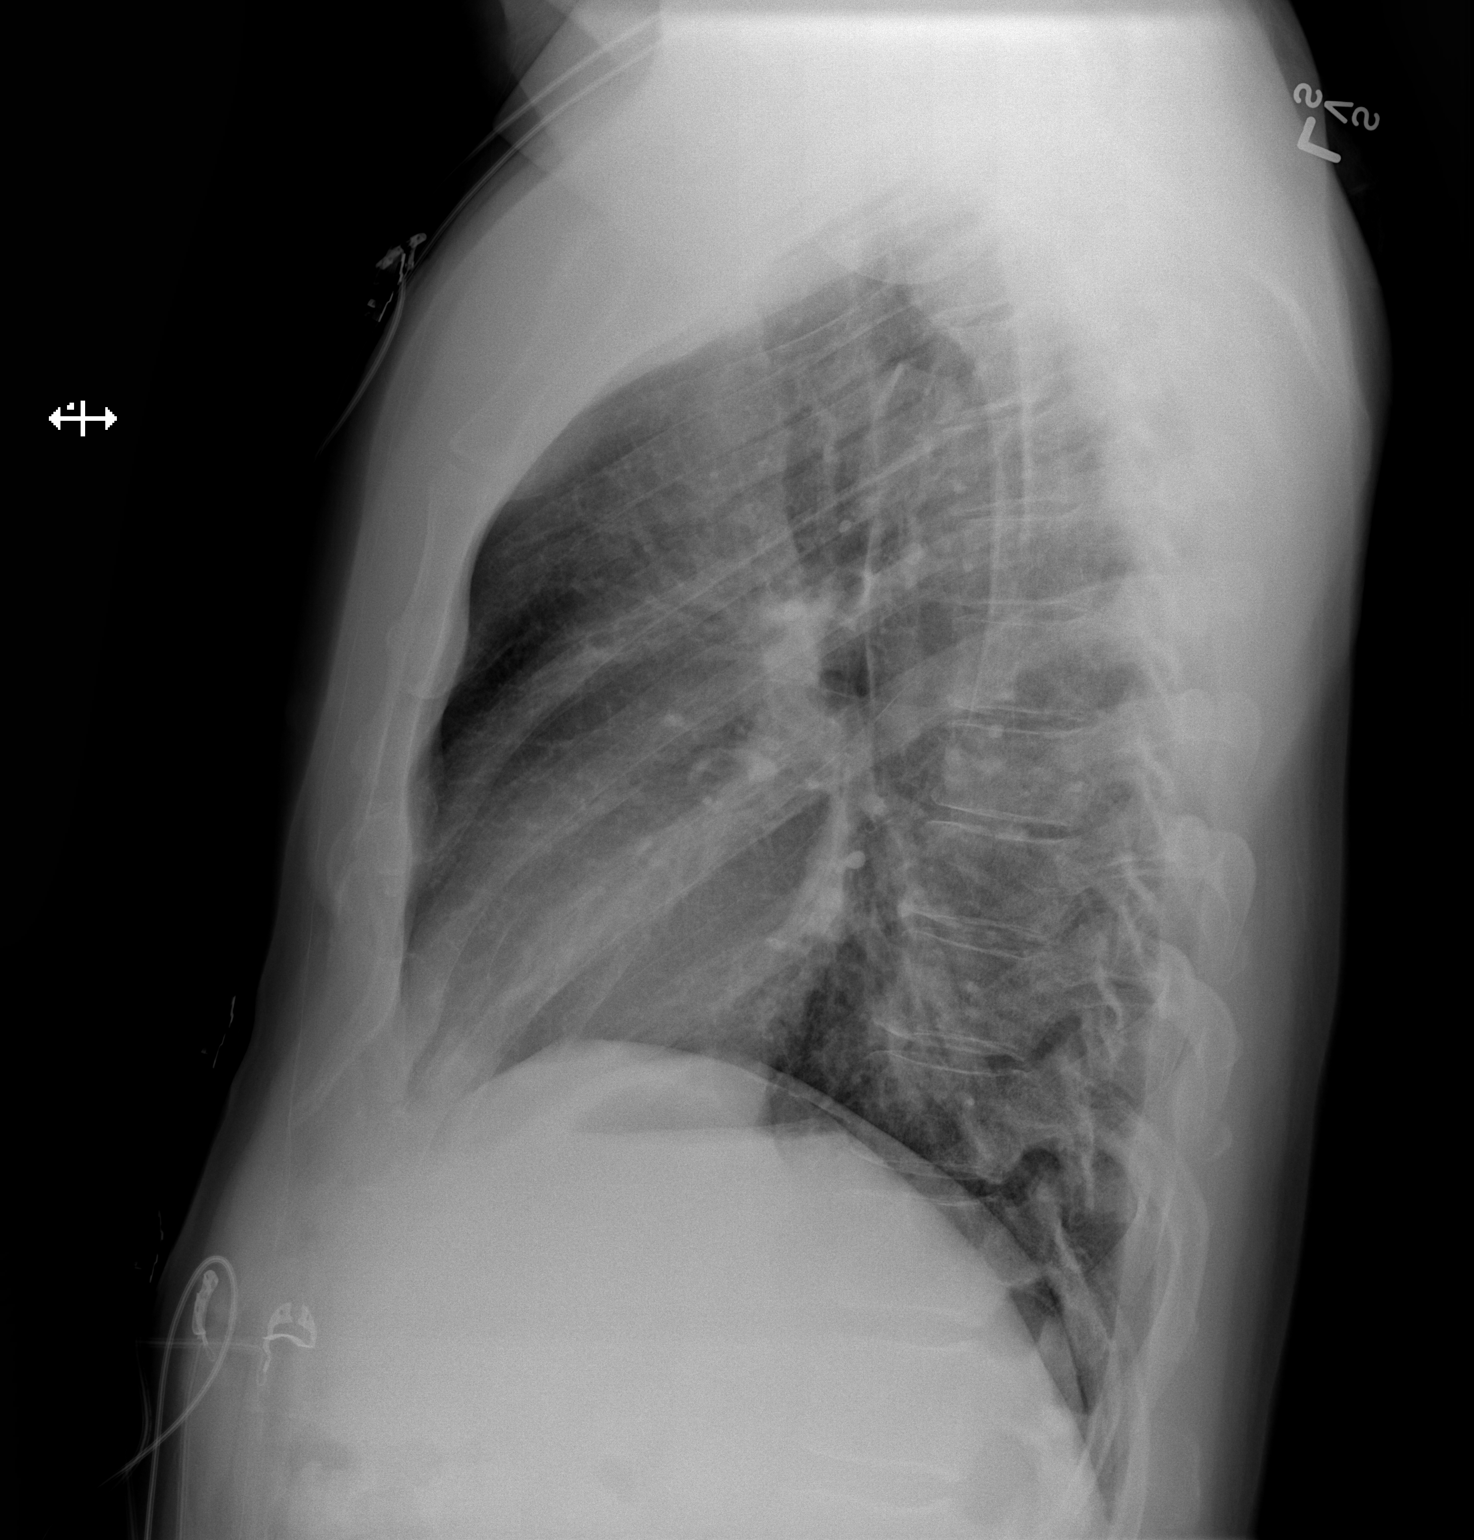

[2 of 2 positions shown; findings below may reference images not displayed]

FINDINGS: Normal heart size and mediastinal contours. No acute infiltrate or
edema. No effusion or pneumothorax. No acute osseous findings.
IMPRESSION: No active cardiopulmonary disease.

## 2016-03-27 DIAGNOSIS — I10 Essential (primary) hypertension: Secondary | ICD-10-CM | POA: Diagnosis not present

## 2016-03-27 DIAGNOSIS — E782 Mixed hyperlipidemia: Secondary | ICD-10-CM | POA: Diagnosis not present

## 2016-03-27 DIAGNOSIS — J309 Allergic rhinitis, unspecified: Secondary | ICD-10-CM | POA: Diagnosis not present

## 2016-03-27 DIAGNOSIS — Z8619 Personal history of other infectious and parasitic diseases: Secondary | ICD-10-CM | POA: Diagnosis not present

## 2016-10-24 DIAGNOSIS — J309 Allergic rhinitis, unspecified: Secondary | ICD-10-CM | POA: Diagnosis not present

## 2016-10-24 DIAGNOSIS — Z0000 Encounter for general adult medical examination without abnormal findings: Secondary | ICD-10-CM | POA: Diagnosis not present

## 2016-10-24 DIAGNOSIS — R0683 Snoring: Secondary | ICD-10-CM | POA: Diagnosis not present

## 2016-10-24 DIAGNOSIS — E782 Mixed hyperlipidemia: Secondary | ICD-10-CM | POA: Diagnosis not present

## 2016-10-24 DIAGNOSIS — I10 Essential (primary) hypertension: Secondary | ICD-10-CM | POA: Diagnosis not present

## 2016-10-24 DIAGNOSIS — Z125 Encounter for screening for malignant neoplasm of prostate: Secondary | ICD-10-CM | POA: Diagnosis not present

## 2016-10-24 DIAGNOSIS — M25511 Pain in right shoulder: Secondary | ICD-10-CM | POA: Diagnosis not present

## 2017-05-17 DIAGNOSIS — J0110 Acute frontal sinusitis, unspecified: Secondary | ICD-10-CM | POA: Diagnosis not present

## 2017-10-25 DIAGNOSIS — Z0000 Encounter for general adult medical examination without abnormal findings: Secondary | ICD-10-CM | POA: Diagnosis not present

## 2017-10-25 DIAGNOSIS — R5383 Other fatigue: Secondary | ICD-10-CM | POA: Diagnosis not present

## 2017-10-25 DIAGNOSIS — Z1211 Encounter for screening for malignant neoplasm of colon: Secondary | ICD-10-CM | POA: Diagnosis not present

## 2017-10-25 DIAGNOSIS — E782 Mixed hyperlipidemia: Secondary | ICD-10-CM | POA: Diagnosis not present

## 2017-10-25 DIAGNOSIS — I10 Essential (primary) hypertension: Secondary | ICD-10-CM | POA: Diagnosis not present

## 2017-10-25 DIAGNOSIS — Z125 Encounter for screening for malignant neoplasm of prostate: Secondary | ICD-10-CM | POA: Diagnosis not present

## 2018-03-26 DIAGNOSIS — Z0000 Encounter for general adult medical examination without abnormal findings: Secondary | ICD-10-CM | POA: Diagnosis not present

## 2018-10-14 DIAGNOSIS — Z86018 Personal history of other benign neoplasm: Secondary | ICD-10-CM | POA: Diagnosis not present

## 2018-10-14 DIAGNOSIS — L814 Other melanin hyperpigmentation: Secondary | ICD-10-CM | POA: Diagnosis not present

## 2018-10-14 DIAGNOSIS — D224 Melanocytic nevi of scalp and neck: Secondary | ICD-10-CM | POA: Diagnosis not present

## 2018-10-14 DIAGNOSIS — D225 Melanocytic nevi of trunk: Secondary | ICD-10-CM | POA: Diagnosis not present

## 2018-11-05 DIAGNOSIS — Z1211 Encounter for screening for malignant neoplasm of colon: Secondary | ICD-10-CM | POA: Diagnosis not present

## 2018-11-05 DIAGNOSIS — E782 Mixed hyperlipidemia: Secondary | ICD-10-CM | POA: Diagnosis not present

## 2018-11-05 DIAGNOSIS — I10 Essential (primary) hypertension: Secondary | ICD-10-CM | POA: Diagnosis not present

## 2018-11-05 DIAGNOSIS — Z125 Encounter for screening for malignant neoplasm of prostate: Secondary | ICD-10-CM | POA: Diagnosis not present

## 2018-11-05 DIAGNOSIS — J309 Allergic rhinitis, unspecified: Secondary | ICD-10-CM | POA: Diagnosis not present

## 2018-11-05 DIAGNOSIS — Z0000 Encounter for general adult medical examination without abnormal findings: Secondary | ICD-10-CM | POA: Diagnosis not present

## 2018-11-05 DIAGNOSIS — M25551 Pain in right hip: Secondary | ICD-10-CM | POA: Diagnosis not present

## 2018-11-11 DIAGNOSIS — L570 Actinic keratosis: Secondary | ICD-10-CM | POA: Diagnosis not present

## 2018-11-15 ENCOUNTER — Ambulatory Visit
Admission: RE | Admit: 2018-11-15 | Discharge: 2018-11-15 | Disposition: A | Discharge status: Home / Self Care | Payer: BLUE CROSS/BLUE SHIELD | Source: Ambulatory Visit | Attending: Family Medicine | Admitting: Family Medicine

## 2018-11-15 ENCOUNTER — Other Ambulatory Visit: Payer: Self-pay | Admitting: Family Medicine

## 2018-11-15 DIAGNOSIS — M25551 Pain in right hip: Secondary | ICD-10-CM | POA: Diagnosis not present

## 2019-10-27 DIAGNOSIS — Z20828 Contact with and (suspected) exposure to other viral communicable diseases: Secondary | ICD-10-CM | POA: Diagnosis not present

## 2019-11-04 DIAGNOSIS — D224 Melanocytic nevi of scalp and neck: Secondary | ICD-10-CM | POA: Diagnosis not present

## 2019-11-04 DIAGNOSIS — L570 Actinic keratosis: Secondary | ICD-10-CM | POA: Diagnosis not present

## 2019-11-04 DIAGNOSIS — D485 Neoplasm of uncertain behavior of skin: Secondary | ICD-10-CM | POA: Diagnosis not present

## 2019-11-04 DIAGNOSIS — D225 Melanocytic nevi of trunk: Secondary | ICD-10-CM | POA: Diagnosis not present

## 2019-11-04 DIAGNOSIS — Z86018 Personal history of other benign neoplasm: Secondary | ICD-10-CM | POA: Diagnosis not present

## 2019-11-04 DIAGNOSIS — L814 Other melanin hyperpigmentation: Secondary | ICD-10-CM | POA: Diagnosis not present

## 2019-11-18 DIAGNOSIS — L570 Actinic keratosis: Secondary | ICD-10-CM | POA: Diagnosis not present

## 2019-12-04 DIAGNOSIS — Z0000 Encounter for general adult medical examination without abnormal findings: Secondary | ICD-10-CM | POA: Diagnosis not present

## 2019-12-04 DIAGNOSIS — Z125 Encounter for screening for malignant neoplasm of prostate: Secondary | ICD-10-CM | POA: Diagnosis not present

## 2019-12-04 DIAGNOSIS — J309 Allergic rhinitis, unspecified: Secondary | ICD-10-CM | POA: Diagnosis not present

## 2019-12-04 DIAGNOSIS — J3489 Other specified disorders of nose and nasal sinuses: Secondary | ICD-10-CM | POA: Diagnosis not present

## 2019-12-04 DIAGNOSIS — M25551 Pain in right hip: Secondary | ICD-10-CM | POA: Diagnosis not present

## 2019-12-04 DIAGNOSIS — I10 Essential (primary) hypertension: Secondary | ICD-10-CM | POA: Diagnosis not present

## 2019-12-04 DIAGNOSIS — E782 Mixed hyperlipidemia: Secondary | ICD-10-CM | POA: Diagnosis not present

## 2019-12-10 DIAGNOSIS — Z1211 Encounter for screening for malignant neoplasm of colon: Secondary | ICD-10-CM | POA: Diagnosis not present

## 2020-08-04 DIAGNOSIS — Z20828 Contact with and (suspected) exposure to other viral communicable diseases: Secondary | ICD-10-CM | POA: Diagnosis not present

## 2020-09-01 DIAGNOSIS — E663 Overweight: Secondary | ICD-10-CM | POA: Diagnosis not present

## 2020-09-01 DIAGNOSIS — Z1329 Encounter for screening for other suspected endocrine disorder: Secondary | ICD-10-CM | POA: Diagnosis not present

## 2020-09-01 DIAGNOSIS — E291 Testicular hypofunction: Secondary | ICD-10-CM | POA: Diagnosis not present

## 2020-09-01 DIAGNOSIS — E290 Testicular hyperfunction: Secondary | ICD-10-CM | POA: Diagnosis not present

## 2020-09-01 DIAGNOSIS — E559 Vitamin D deficiency, unspecified: Secondary | ICD-10-CM | POA: Diagnosis not present

## 2020-09-01 DIAGNOSIS — H70009 Acute mastoiditis without complications, unspecified ear: Secondary | ICD-10-CM | POA: Diagnosis not present

## 2020-09-01 DIAGNOSIS — E538 Deficiency of other specified B group vitamins: Secondary | ICD-10-CM | POA: Diagnosis not present

## 2020-09-01 DIAGNOSIS — E785 Hyperlipidemia, unspecified: Secondary | ICD-10-CM | POA: Diagnosis not present

## 2020-09-01 DIAGNOSIS — Z131 Encounter for screening for diabetes mellitus: Secondary | ICD-10-CM | POA: Diagnosis not present

## 2020-09-08 DIAGNOSIS — Z20822 Contact with and (suspected) exposure to covid-19: Secondary | ICD-10-CM | POA: Diagnosis not present

## 2020-10-04 DIAGNOSIS — E785 Hyperlipidemia, unspecified: Secondary | ICD-10-CM | POA: Diagnosis not present

## 2020-10-04 DIAGNOSIS — Z131 Encounter for screening for diabetes mellitus: Secondary | ICD-10-CM | POA: Diagnosis not present

## 2020-10-04 DIAGNOSIS — E290 Testicular hyperfunction: Secondary | ICD-10-CM | POA: Diagnosis not present

## 2020-10-04 DIAGNOSIS — J302 Other seasonal allergic rhinitis: Secondary | ICD-10-CM | POA: Diagnosis not present

## 2020-11-10 DIAGNOSIS — D225 Melanocytic nevi of trunk: Secondary | ICD-10-CM | POA: Diagnosis not present

## 2020-11-10 DIAGNOSIS — D224 Melanocytic nevi of scalp and neck: Secondary | ICD-10-CM | POA: Diagnosis not present

## 2020-11-10 DIAGNOSIS — Z86018 Personal history of other benign neoplasm: Secondary | ICD-10-CM | POA: Diagnosis not present

## 2020-11-10 DIAGNOSIS — L814 Other melanin hyperpigmentation: Secondary | ICD-10-CM | POA: Diagnosis not present

## 2020-12-06 ENCOUNTER — Other Ambulatory Visit: Payer: BLUE CROSS/BLUE SHIELD

## 2021-01-11 DIAGNOSIS — E559 Vitamin D deficiency, unspecified: Secondary | ICD-10-CM | POA: Diagnosis not present

## 2021-01-11 DIAGNOSIS — R0989 Other specified symptoms and signs involving the circulatory and respiratory systems: Secondary | ICD-10-CM | POA: Diagnosis not present

## 2021-01-11 DIAGNOSIS — E785 Hyperlipidemia, unspecified: Secondary | ICD-10-CM | POA: Diagnosis not present

## 2021-01-11 DIAGNOSIS — R5383 Other fatigue: Secondary | ICD-10-CM | POA: Diagnosis not present

## 2021-01-11 DIAGNOSIS — E290 Testicular hyperfunction: Secondary | ICD-10-CM | POA: Diagnosis not present

## 2021-01-11 DIAGNOSIS — E039 Hypothyroidism, unspecified: Secondary | ICD-10-CM | POA: Diagnosis not present

## 2021-01-11 DIAGNOSIS — E663 Overweight: Secondary | ICD-10-CM | POA: Diagnosis not present

## 2021-01-11 DIAGNOSIS — E538 Deficiency of other specified B group vitamins: Secondary | ICD-10-CM | POA: Diagnosis not present

## 2021-01-11 DIAGNOSIS — J302 Other seasonal allergic rhinitis: Secondary | ICD-10-CM | POA: Diagnosis not present

## 2021-01-11 DIAGNOSIS — Z131 Encounter for screening for diabetes mellitus: Secondary | ICD-10-CM | POA: Diagnosis not present

## 2021-01-19 DIAGNOSIS — E290 Testicular hyperfunction: Secondary | ICD-10-CM | POA: Diagnosis not present

## 2021-01-19 DIAGNOSIS — J302 Other seasonal allergic rhinitis: Secondary | ICD-10-CM | POA: Diagnosis not present

## 2021-01-19 DIAGNOSIS — E785 Hyperlipidemia, unspecified: Secondary | ICD-10-CM | POA: Diagnosis not present

## 2021-01-19 DIAGNOSIS — Z131 Encounter for screening for diabetes mellitus: Secondary | ICD-10-CM | POA: Diagnosis not present

## 2021-04-14 DIAGNOSIS — Z0000 Encounter for general adult medical examination without abnormal findings: Secondary | ICD-10-CM | POA: Diagnosis not present

## 2021-04-14 DIAGNOSIS — I10 Essential (primary) hypertension: Secondary | ICD-10-CM | POA: Diagnosis not present

## 2021-04-14 DIAGNOSIS — Z125 Encounter for screening for malignant neoplasm of prostate: Secondary | ICD-10-CM | POA: Diagnosis not present

## 2021-04-14 DIAGNOSIS — M7741 Metatarsalgia, right foot: Secondary | ICD-10-CM | POA: Diagnosis not present

## 2021-04-14 DIAGNOSIS — E782 Mixed hyperlipidemia: Secondary | ICD-10-CM | POA: Diagnosis not present

## 2021-04-14 DIAGNOSIS — J309 Allergic rhinitis, unspecified: Secondary | ICD-10-CM | POA: Diagnosis not present

## 2021-04-18 DIAGNOSIS — C44319 Basal cell carcinoma of skin of other parts of face: Secondary | ICD-10-CM | POA: Diagnosis not present

## 2021-04-18 DIAGNOSIS — D485 Neoplasm of uncertain behavior of skin: Secondary | ICD-10-CM | POA: Diagnosis not present

## 2021-04-18 DIAGNOSIS — C44519 Basal cell carcinoma of skin of other part of trunk: Secondary | ICD-10-CM | POA: Diagnosis not present

## 2021-04-26 DIAGNOSIS — R5383 Other fatigue: Secondary | ICD-10-CM | POA: Diagnosis not present

## 2021-04-26 DIAGNOSIS — E559 Vitamin D deficiency, unspecified: Secondary | ICD-10-CM | POA: Diagnosis not present

## 2021-04-26 DIAGNOSIS — E039 Hypothyroidism, unspecified: Secondary | ICD-10-CM | POA: Diagnosis not present

## 2021-04-26 DIAGNOSIS — E663 Overweight: Secondary | ICD-10-CM | POA: Diagnosis not present

## 2021-04-26 DIAGNOSIS — J302 Other seasonal allergic rhinitis: Secondary | ICD-10-CM | POA: Diagnosis not present

## 2021-04-26 DIAGNOSIS — E785 Hyperlipidemia, unspecified: Secondary | ICD-10-CM | POA: Diagnosis not present

## 2021-04-27 DIAGNOSIS — G4733 Obstructive sleep apnea (adult) (pediatric): Secondary | ICD-10-CM | POA: Diagnosis not present

## 2021-05-16 DIAGNOSIS — E039 Hypothyroidism, unspecified: Secondary | ICD-10-CM | POA: Diagnosis not present

## 2021-05-16 DIAGNOSIS — E290 Testicular hyperfunction: Secondary | ICD-10-CM | POA: Diagnosis not present

## 2021-05-16 DIAGNOSIS — R5383 Other fatigue: Secondary | ICD-10-CM | POA: Diagnosis not present

## 2021-05-16 DIAGNOSIS — E785 Hyperlipidemia, unspecified: Secondary | ICD-10-CM | POA: Diagnosis not present

## 2021-06-15 DIAGNOSIS — G4733 Obstructive sleep apnea (adult) (pediatric): Secondary | ICD-10-CM | POA: Diagnosis not present

## 2021-06-30 DIAGNOSIS — C4441 Basal cell carcinoma of skin of scalp and neck: Secondary | ICD-10-CM | POA: Diagnosis not present

## 2021-07-20 DIAGNOSIS — G4733 Obstructive sleep apnea (adult) (pediatric): Secondary | ICD-10-CM | POA: Diagnosis not present

## 2021-08-02 DIAGNOSIS — C44519 Basal cell carcinoma of skin of other part of trunk: Secondary | ICD-10-CM | POA: Diagnosis not present

## 2021-08-20 DIAGNOSIS — G4733 Obstructive sleep apnea (adult) (pediatric): Secondary | ICD-10-CM | POA: Diagnosis not present

## 2021-09-19 DIAGNOSIS — G4733 Obstructive sleep apnea (adult) (pediatric): Secondary | ICD-10-CM | POA: Diagnosis not present

## 2021-10-20 DIAGNOSIS — G4733 Obstructive sleep apnea (adult) (pediatric): Secondary | ICD-10-CM | POA: Diagnosis not present

## 2021-11-09 DIAGNOSIS — Z86018 Personal history of other benign neoplasm: Secondary | ICD-10-CM | POA: Diagnosis not present

## 2021-11-09 DIAGNOSIS — D224 Melanocytic nevi of scalp and neck: Secondary | ICD-10-CM | POA: Diagnosis not present

## 2021-11-09 DIAGNOSIS — L814 Other melanin hyperpigmentation: Secondary | ICD-10-CM | POA: Diagnosis not present

## 2021-11-09 DIAGNOSIS — D225 Melanocytic nevi of trunk: Secondary | ICD-10-CM | POA: Diagnosis not present

## 2021-11-19 DIAGNOSIS — G4733 Obstructive sleep apnea (adult) (pediatric): Secondary | ICD-10-CM | POA: Diagnosis not present

## 2021-12-05 DIAGNOSIS — G4733 Obstructive sleep apnea (adult) (pediatric): Secondary | ICD-10-CM | POA: Diagnosis not present

## 2021-12-20 DIAGNOSIS — G4733 Obstructive sleep apnea (adult) (pediatric): Secondary | ICD-10-CM | POA: Diagnosis not present

## 2022-01-20 DIAGNOSIS — G4733 Obstructive sleep apnea (adult) (pediatric): Secondary | ICD-10-CM | POA: Diagnosis not present

## 2022-02-01 DIAGNOSIS — Z131 Encounter for screening for diabetes mellitus: Secondary | ICD-10-CM | POA: Diagnosis not present

## 2022-02-01 DIAGNOSIS — E039 Hypothyroidism, unspecified: Secondary | ICD-10-CM | POA: Diagnosis not present

## 2022-02-01 DIAGNOSIS — Z1329 Encounter for screening for other suspected endocrine disorder: Secondary | ICD-10-CM | POA: Diagnosis not present

## 2022-02-01 DIAGNOSIS — E290 Testicular hyperfunction: Secondary | ICD-10-CM | POA: Diagnosis not present

## 2022-02-01 DIAGNOSIS — E559 Vitamin D deficiency, unspecified: Secondary | ICD-10-CM | POA: Diagnosis not present

## 2022-02-01 DIAGNOSIS — E538 Deficiency of other specified B group vitamins: Secondary | ICD-10-CM | POA: Diagnosis not present

## 2022-02-01 DIAGNOSIS — R5383 Other fatigue: Secondary | ICD-10-CM | POA: Diagnosis not present

## 2022-02-14 DIAGNOSIS — E039 Hypothyroidism, unspecified: Secondary | ICD-10-CM | POA: Diagnosis not present

## 2022-02-14 DIAGNOSIS — E290 Testicular hyperfunction: Secondary | ICD-10-CM | POA: Diagnosis not present

## 2022-02-14 DIAGNOSIS — E785 Hyperlipidemia, unspecified: Secondary | ICD-10-CM | POA: Diagnosis not present

## 2022-02-14 DIAGNOSIS — R5383 Other fatigue: Secondary | ICD-10-CM | POA: Diagnosis not present

## 2022-02-17 DIAGNOSIS — G4733 Obstructive sleep apnea (adult) (pediatric): Secondary | ICD-10-CM | POA: Diagnosis not present

## 2022-03-06 DIAGNOSIS — G4733 Obstructive sleep apnea (adult) (pediatric): Secondary | ICD-10-CM | POA: Diagnosis not present

## 2022-03-20 DIAGNOSIS — G4733 Obstructive sleep apnea (adult) (pediatric): Secondary | ICD-10-CM | POA: Diagnosis not present

## 2022-04-19 DIAGNOSIS — G4733 Obstructive sleep apnea (adult) (pediatric): Secondary | ICD-10-CM | POA: Diagnosis not present

## 2022-05-26 DIAGNOSIS — Z0000 Encounter for general adult medical examination without abnormal findings: Secondary | ICD-10-CM | POA: Diagnosis not present

## 2022-05-26 DIAGNOSIS — J309 Allergic rhinitis, unspecified: Secondary | ICD-10-CM | POA: Diagnosis not present

## 2022-05-26 DIAGNOSIS — I10 Essential (primary) hypertension: Secondary | ICD-10-CM | POA: Diagnosis not present

## 2022-05-26 DIAGNOSIS — Z125 Encounter for screening for malignant neoplasm of prostate: Secondary | ICD-10-CM | POA: Diagnosis not present

## 2022-05-26 DIAGNOSIS — E782 Mixed hyperlipidemia: Secondary | ICD-10-CM | POA: Diagnosis not present

## 2022-06-05 DIAGNOSIS — G4733 Obstructive sleep apnea (adult) (pediatric): Secondary | ICD-10-CM | POA: Diagnosis not present

## 2022-09-05 DIAGNOSIS — G4733 Obstructive sleep apnea (adult) (pediatric): Secondary | ICD-10-CM | POA: Diagnosis not present

## 2022-12-06 DIAGNOSIS — G4733 Obstructive sleep apnea (adult) (pediatric): Secondary | ICD-10-CM | POA: Diagnosis not present

## 2022-12-07 DIAGNOSIS — J309 Allergic rhinitis, unspecified: Secondary | ICD-10-CM | POA: Diagnosis not present

## 2022-12-07 DIAGNOSIS — R0981 Nasal congestion: Secondary | ICD-10-CM | POA: Diagnosis not present

## 2022-12-12 DIAGNOSIS — D225 Melanocytic nevi of trunk: Secondary | ICD-10-CM | POA: Diagnosis not present

## 2022-12-12 DIAGNOSIS — Z86018 Personal history of other benign neoplasm: Secondary | ICD-10-CM | POA: Diagnosis not present

## 2022-12-12 DIAGNOSIS — L814 Other melanin hyperpigmentation: Secondary | ICD-10-CM | POA: Diagnosis not present

## 2022-12-12 DIAGNOSIS — L570 Actinic keratosis: Secondary | ICD-10-CM | POA: Diagnosis not present

## 2022-12-12 DIAGNOSIS — D224 Melanocytic nevi of scalp and neck: Secondary | ICD-10-CM | POA: Diagnosis not present

## 2022-12-28 DIAGNOSIS — I10 Essential (primary) hypertension: Secondary | ICD-10-CM | POA: Diagnosis not present

## 2022-12-28 DIAGNOSIS — E782 Mixed hyperlipidemia: Secondary | ICD-10-CM | POA: Diagnosis not present

## 2022-12-28 DIAGNOSIS — E039 Hypothyroidism, unspecified: Secondary | ICD-10-CM | POA: Diagnosis not present

## 2022-12-28 DIAGNOSIS — J309 Allergic rhinitis, unspecified: Secondary | ICD-10-CM | POA: Diagnosis not present

## 2023-02-21 DIAGNOSIS — E538 Deficiency of other specified B group vitamins: Secondary | ICD-10-CM | POA: Diagnosis not present

## 2023-02-21 DIAGNOSIS — E039 Hypothyroidism, unspecified: Secondary | ICD-10-CM | POA: Diagnosis not present

## 2023-02-21 DIAGNOSIS — E290 Testicular hyperfunction: Secondary | ICD-10-CM | POA: Diagnosis not present

## 2023-02-21 DIAGNOSIS — E785 Hyperlipidemia, unspecified: Secondary | ICD-10-CM | POA: Diagnosis not present

## 2023-02-21 DIAGNOSIS — R5383 Other fatigue: Secondary | ICD-10-CM | POA: Diagnosis not present

## 2023-02-21 DIAGNOSIS — R0989 Other specified symptoms and signs involving the circulatory and respiratory systems: Secondary | ICD-10-CM | POA: Diagnosis not present

## 2023-02-21 DIAGNOSIS — E559 Vitamin D deficiency, unspecified: Secondary | ICD-10-CM | POA: Diagnosis not present

## 2023-03-07 DIAGNOSIS — R5383 Other fatigue: Secondary | ICD-10-CM | POA: Diagnosis not present

## 2023-03-07 DIAGNOSIS — E039 Hypothyroidism, unspecified: Secondary | ICD-10-CM | POA: Diagnosis not present

## 2023-03-07 DIAGNOSIS — I10 Essential (primary) hypertension: Secondary | ICD-10-CM | POA: Diagnosis not present

## 2023-03-07 DIAGNOSIS — G4733 Obstructive sleep apnea (adult) (pediatric): Secondary | ICD-10-CM | POA: Diagnosis not present

## 2023-03-07 DIAGNOSIS — E785 Hyperlipidemia, unspecified: Secondary | ICD-10-CM | POA: Diagnosis not present

## 2023-06-06 ENCOUNTER — Other Ambulatory Visit: Payer: Self-pay | Admitting: Family Medicine

## 2023-06-06 DIAGNOSIS — I10 Essential (primary) hypertension: Secondary | ICD-10-CM | POA: Diagnosis not present

## 2023-06-06 DIAGNOSIS — E782 Mixed hyperlipidemia: Secondary | ICD-10-CM

## 2023-06-06 DIAGNOSIS — E039 Hypothyroidism, unspecified: Secondary | ICD-10-CM | POA: Diagnosis not present

## 2023-06-06 DIAGNOSIS — G4733 Obstructive sleep apnea (adult) (pediatric): Secondary | ICD-10-CM | POA: Diagnosis not present

## 2023-06-06 DIAGNOSIS — Z0000 Encounter for general adult medical examination without abnormal findings: Secondary | ICD-10-CM | POA: Diagnosis not present

## 2023-06-06 DIAGNOSIS — E291 Testicular hypofunction: Secondary | ICD-10-CM | POA: Diagnosis not present

## 2023-06-19 ENCOUNTER — Ambulatory Visit
Admission: RE | Admit: 2023-06-19 | Discharge: 2023-06-19 | Disposition: A | Discharge status: Home / Self Care | Payer: Self-pay | Source: Ambulatory Visit | Attending: Family Medicine | Admitting: Family Medicine

## 2023-06-19 DIAGNOSIS — E782 Mixed hyperlipidemia: Secondary | ICD-10-CM | POA: Insufficient documentation

## 2023-06-19 DIAGNOSIS — H43393 Other vitreous opacities, bilateral: Secondary | ICD-10-CM | POA: Diagnosis not present

## 2023-06-23 DIAGNOSIS — H53131 Sudden visual loss, right eye: Secondary | ICD-10-CM | POA: Diagnosis not present

## 2023-06-23 DIAGNOSIS — I10 Essential (primary) hypertension: Secondary | ICD-10-CM | POA: Diagnosis not present

## 2023-06-23 DIAGNOSIS — H5461 Unqualified visual loss, right eye, normal vision left eye: Secondary | ICD-10-CM | POA: Diagnosis not present

## 2023-06-23 DIAGNOSIS — Z9889 Other specified postprocedural states: Secondary | ICD-10-CM | POA: Diagnosis not present

## 2023-06-23 DIAGNOSIS — H538 Other visual disturbances: Secondary | ICD-10-CM | POA: Diagnosis not present

## 2023-06-23 DIAGNOSIS — Z79899 Other long term (current) drug therapy: Secondary | ICD-10-CM | POA: Diagnosis not present

## 2023-06-23 DIAGNOSIS — E039 Hypothyroidism, unspecified: Secondary | ICD-10-CM | POA: Diagnosis not present

## 2023-06-23 DIAGNOSIS — H43391 Other vitreous opacities, right eye: Secondary | ICD-10-CM | POA: Diagnosis not present

## 2023-06-24 DIAGNOSIS — H5461 Unqualified visual loss, right eye, normal vision left eye: Secondary | ICD-10-CM | POA: Diagnosis not present

## 2023-06-24 DIAGNOSIS — H33311 Horseshoe tear of retina without detachment, right eye: Secondary | ICD-10-CM | POA: Diagnosis not present

## 2023-06-24 DIAGNOSIS — Z79890 Hormone replacement therapy (postmenopausal): Secondary | ICD-10-CM | POA: Diagnosis not present

## 2023-06-24 DIAGNOSIS — H4311 Vitreous hemorrhage, right eye: Secondary | ICD-10-CM | POA: Diagnosis not present

## 2023-06-24 DIAGNOSIS — H547 Unspecified visual loss: Secondary | ICD-10-CM | POA: Diagnosis not present

## 2023-06-24 DIAGNOSIS — H33309 Unspecified retinal break, unspecified eye: Secondary | ICD-10-CM | POA: Diagnosis not present

## 2023-06-24 DIAGNOSIS — I10 Essential (primary) hypertension: Secondary | ICD-10-CM | POA: Diagnosis not present

## 2023-06-24 DIAGNOSIS — Z79899 Other long term (current) drug therapy: Secondary | ICD-10-CM | POA: Diagnosis not present

## 2023-06-28 DIAGNOSIS — H43811 Vitreous degeneration, right eye: Secondary | ICD-10-CM | POA: Diagnosis not present

## 2023-07-04 DIAGNOSIS — Z0000 Encounter for general adult medical examination without abnormal findings: Secondary | ICD-10-CM | POA: Diagnosis not present

## 2023-07-04 DIAGNOSIS — E782 Mixed hyperlipidemia: Secondary | ICD-10-CM | POA: Diagnosis not present

## 2023-07-17 DIAGNOSIS — H33001 Unspecified retinal detachment with retinal break, right eye: Secondary | ICD-10-CM | POA: Diagnosis not present

## 2023-07-18 DIAGNOSIS — H3321 Serous retinal detachment, right eye: Secondary | ICD-10-CM | POA: Diagnosis not present

## 2023-07-23 DIAGNOSIS — H33001 Unspecified retinal detachment with retinal break, right eye: Secondary | ICD-10-CM | POA: Diagnosis not present

## 2023-08-07 DIAGNOSIS — H3321 Serous retinal detachment, right eye: Secondary | ICD-10-CM | POA: Diagnosis not present

## 2023-09-06 DIAGNOSIS — G4733 Obstructive sleep apnea (adult) (pediatric): Secondary | ICD-10-CM | POA: Diagnosis not present

## 2023-09-21 DIAGNOSIS — K640 First degree hemorrhoids: Secondary | ICD-10-CM | POA: Diagnosis not present

## 2023-09-21 DIAGNOSIS — K635 Polyp of colon: Secondary | ICD-10-CM | POA: Diagnosis not present

## 2023-09-21 DIAGNOSIS — Z1211 Encounter for screening for malignant neoplasm of colon: Secondary | ICD-10-CM | POA: Diagnosis not present

## 2023-09-21 DIAGNOSIS — K5730 Diverticulosis of large intestine without perforation or abscess without bleeding: Secondary | ICD-10-CM | POA: Diagnosis not present

## 2023-09-21 DIAGNOSIS — D123 Benign neoplasm of transverse colon: Secondary | ICD-10-CM | POA: Diagnosis not present

## 2023-11-02 DIAGNOSIS — H59811 Chorioretinal scars after surgery for detachment, right eye: Secondary | ICD-10-CM | POA: Diagnosis not present

## 2023-11-02 DIAGNOSIS — H2513 Age-related nuclear cataract, bilateral: Secondary | ICD-10-CM | POA: Diagnosis not present

## 2023-12-07 DIAGNOSIS — G4733 Obstructive sleep apnea (adult) (pediatric): Secondary | ICD-10-CM | POA: Diagnosis not present

## 2023-12-18 DIAGNOSIS — H903 Sensorineural hearing loss, bilateral: Secondary | ICD-10-CM | POA: Diagnosis not present

## 2024-01-01 DIAGNOSIS — L814 Other melanin hyperpigmentation: Secondary | ICD-10-CM | POA: Diagnosis not present

## 2024-01-01 DIAGNOSIS — D225 Melanocytic nevi of trunk: Secondary | ICD-10-CM | POA: Diagnosis not present

## 2024-01-01 DIAGNOSIS — Z86018 Personal history of other benign neoplasm: Secondary | ICD-10-CM | POA: Diagnosis not present

## 2024-01-01 DIAGNOSIS — D224 Melanocytic nevi of scalp and neck: Secondary | ICD-10-CM | POA: Diagnosis not present

## 2024-01-04 DIAGNOSIS — J309 Allergic rhinitis, unspecified: Secondary | ICD-10-CM | POA: Diagnosis not present

## 2024-01-04 DIAGNOSIS — I10 Essential (primary) hypertension: Secondary | ICD-10-CM | POA: Diagnosis not present

## 2024-01-04 DIAGNOSIS — E782 Mixed hyperlipidemia: Secondary | ICD-10-CM | POA: Diagnosis not present

## 2024-01-04 DIAGNOSIS — E039 Hypothyroidism, unspecified: Secondary | ICD-10-CM | POA: Diagnosis not present

## 2024-03-06 DIAGNOSIS — E785 Hyperlipidemia, unspecified: Secondary | ICD-10-CM | POA: Diagnosis not present

## 2024-03-06 DIAGNOSIS — E290 Testicular hyperfunction: Secondary | ICD-10-CM | POA: Diagnosis not present

## 2024-03-06 DIAGNOSIS — R5383 Other fatigue: Secondary | ICD-10-CM | POA: Diagnosis not present

## 2024-03-06 DIAGNOSIS — G4733 Obstructive sleep apnea (adult) (pediatric): Secondary | ICD-10-CM | POA: Diagnosis not present

## 2024-03-06 DIAGNOSIS — E039 Hypothyroidism, unspecified: Secondary | ICD-10-CM | POA: Diagnosis not present

## 2024-03-06 DIAGNOSIS — E559 Vitamin D deficiency, unspecified: Secondary | ICD-10-CM | POA: Diagnosis not present

## 2024-03-10 DIAGNOSIS — H903 Sensorineural hearing loss, bilateral: Secondary | ICD-10-CM | POA: Diagnosis not present

## 2024-03-11 DIAGNOSIS — R5383 Other fatigue: Secondary | ICD-10-CM | POA: Diagnosis not present

## 2024-03-11 DIAGNOSIS — E039 Hypothyroidism, unspecified: Secondary | ICD-10-CM | POA: Diagnosis not present

## 2024-03-11 DIAGNOSIS — E785 Hyperlipidemia, unspecified: Secondary | ICD-10-CM | POA: Diagnosis not present

## 2024-03-11 DIAGNOSIS — I10 Essential (primary) hypertension: Secondary | ICD-10-CM | POA: Diagnosis not present

## 2024-05-05 DIAGNOSIS — Z8669 Personal history of other diseases of the nervous system and sense organs: Secondary | ICD-10-CM | POA: Diagnosis not present

## 2024-05-05 DIAGNOSIS — H2513 Age-related nuclear cataract, bilateral: Secondary | ICD-10-CM | POA: Diagnosis not present

## 2024-05-05 DIAGNOSIS — H338 Other retinal detachments: Secondary | ICD-10-CM | POA: Diagnosis not present

## 2024-05-05 DIAGNOSIS — G4733 Obstructive sleep apnea (adult) (pediatric): Secondary | ICD-10-CM | POA: Diagnosis not present

## 2024-06-05 DIAGNOSIS — G4733 Obstructive sleep apnea (adult) (pediatric): Secondary | ICD-10-CM | POA: Diagnosis not present

## 2024-06-17 DIAGNOSIS — E038 Other specified hypothyroidism: Secondary | ICD-10-CM | POA: Diagnosis not present

## 2024-06-17 DIAGNOSIS — Z125 Encounter for screening for malignant neoplasm of prostate: Secondary | ICD-10-CM | POA: Diagnosis not present

## 2024-06-17 DIAGNOSIS — Z79899 Other long term (current) drug therapy: Secondary | ICD-10-CM | POA: Diagnosis not present

## 2024-06-17 DIAGNOSIS — Z0001 Encounter for general adult medical examination with abnormal findings: Secondary | ICD-10-CM | POA: Diagnosis not present

## 2024-06-17 DIAGNOSIS — D6489 Other specified anemias: Secondary | ICD-10-CM | POA: Diagnosis not present

## 2024-06-17 DIAGNOSIS — R739 Hyperglycemia, unspecified: Secondary | ICD-10-CM | POA: Diagnosis not present

## 2024-06-17 DIAGNOSIS — E782 Mixed hyperlipidemia: Secondary | ICD-10-CM | POA: Diagnosis not present

## 2024-06-26 DIAGNOSIS — I10 Essential (primary) hypertension: Secondary | ICD-10-CM | POA: Diagnosis not present

## 2024-06-26 DIAGNOSIS — Z0001 Encounter for general adult medical examination with abnormal findings: Secondary | ICD-10-CM | POA: Diagnosis not present

## 2024-06-26 DIAGNOSIS — E782 Mixed hyperlipidemia: Secondary | ICD-10-CM | POA: Diagnosis not present

## 2024-06-26 DIAGNOSIS — E038 Other specified hypothyroidism: Secondary | ICD-10-CM | POA: Diagnosis not present

## 2024-06-26 DIAGNOSIS — Z6829 Body mass index (BMI) 29.0-29.9, adult: Secondary | ICD-10-CM | POA: Diagnosis not present

## 2024-07-21 DIAGNOSIS — D485 Neoplasm of uncertain behavior of skin: Secondary | ICD-10-CM | POA: Diagnosis not present

## 2024-07-21 DIAGNOSIS — L821 Other seborrheic keratosis: Secondary | ICD-10-CM | POA: Diagnosis not present

## 2024-07-21 DIAGNOSIS — C44519 Basal cell carcinoma of skin of other part of trunk: Secondary | ICD-10-CM | POA: Diagnosis not present

## 2024-07-24 DIAGNOSIS — D3131 Benign neoplasm of right choroid: Secondary | ICD-10-CM | POA: Diagnosis not present

## 2024-08-06 DIAGNOSIS — C44519 Basal cell carcinoma of skin of other part of trunk: Secondary | ICD-10-CM | POA: Diagnosis not present

## 2024-11-05 DIAGNOSIS — H3321 Serous retinal detachment, right eye: Secondary | ICD-10-CM | POA: Diagnosis not present

## 2024-11-05 DIAGNOSIS — H33319 Horseshoe tear of retina without detachment, unspecified eye: Secondary | ICD-10-CM | POA: Diagnosis not present

## 2024-11-05 DIAGNOSIS — H269 Unspecified cataract: Secondary | ICD-10-CM | POA: Diagnosis not present
